# Patient Record
Sex: Male | Born: 1990 | Race: White | Hispanic: No | Marital: Single | Smoking: Current every day smoker
Health system: Southern US, Community
[De-identification: ages and names within clinical notes are randomized; demographics above are authoritative.]

## PROBLEM LIST (undated history)

## (undated) DIAGNOSIS — F329 Major depressive disorder, single episode, unspecified: Secondary | ICD-10-CM

## (undated) DIAGNOSIS — F419 Anxiety disorder, unspecified: Secondary | ICD-10-CM

## (undated) HISTORY — PX: FRACTURE SURGERY: SHX138

---
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
## 2014-10-16 DIAGNOSIS — F419 Anxiety disorder, unspecified: Secondary | ICD-10-CM

## 2014-10-16 DIAGNOSIS — F32A Depression, unspecified: Secondary | ICD-10-CM

## 2014-10-16 HISTORY — DX: Depression, unspecified: F32.A

## 2014-10-16 HISTORY — DX: Anxiety disorder, unspecified: F41.9

## 2016-01-18 ENCOUNTER — Ambulatory Visit (INDEPENDENT_AMBULATORY_CARE_PROVIDER_SITE_OTHER): Payer: Worker's Compensation

## 2016-01-18 ENCOUNTER — Ambulatory Visit (INDEPENDENT_AMBULATORY_CARE_PROVIDER_SITE_OTHER): Payer: Worker's Compensation | Admitting: Family Medicine

## 2016-01-18 ENCOUNTER — Encounter: Payer: Self-pay | Admitting: Family Medicine

## 2016-01-18 VITALS — BP 133/73 | HR 56 | Temp 98.3°F | Ht 72.0 in | Wt 216.0 lb

## 2016-01-18 DIAGNOSIS — S9032XA Contusion of left foot, initial encounter: Secondary | ICD-10-CM

## 2016-01-18 DIAGNOSIS — M25572 Pain in left ankle and joints of left foot: Secondary | ICD-10-CM

## 2016-01-18 NOTE — Progress Notes (Signed)
BP 133/73 mmHg  Pulse 56  Temp(Src) 98.3 F (36.8 C) (Oral)  Ht 6' (1.829 m)  Wt 216 lb (97.977 kg)  BMI 29.29 kg/m2   Subjective:    Patient ID: Warren Khan, male    DOB: [birth year], 25 y.o.   MRN: [id number]  HPI: Warren Khan is a 25 y.o. male presenting on 01/18/2016 for Left foot pain, dropped a tv on foot   HPI Left foot pain Patient comes in as a worker's comp visit where he works at [employer]. He injured his foot today on 01/18/2016. He dropped a television that he was carrying onto that left foot and has been having pain and difficulty walking on that left foot since then.He does have some difficulty bearing weight on that foot. He has some swelling and bruising as well.  Relevant past medical, surgical, family and social history reviewed and updated as indicated. Interim medical history since our last visit reviewed. Allergies and medications reviewed and updated.  Review of Systems  Constitutional: Negative for fever.  HENT: Negative for ear discharge and ear pain.   Eyes: Negative for discharge and visual disturbance.  Respiratory: Negative for shortness of breath and wheezing.   Cardiovascular: Negative for chest pain and leg swelling.  Gastrointestinal: Negative for abdominal pain, diarrhea and constipation.  Genitourinary: Negative for difficulty urinating.  Musculoskeletal: Positive for joint swelling and arthralgias. Negative for myalgias, back pain and gait problem.  Skin: Positive for color change. Negative for rash.  Neurological: Negative for syncope, light-headedness and headaches.  All other systems reviewed and are negative.   Per HPI unless specifically indicated above     Medication List    Notice  As of 01/18/2016 12:00 PM   You have not been prescribed any medications.        Objective:    BP 133/73 mmHg  Pulse 56  Temp(Src) 98.3 F (36.8 C) (Oral)  Ht 6' (1.829 m)  Wt 216 lb (97.977 kg)  BMI 29.29 kg/m2  Wt  Readings from Last 3 Encounters:  01/18/16 216 lb (97.977 kg)    Physical Exam  Constitutional: He is oriented to person, place, and time. He appears well-developed and well-nourished. No distress.  Eyes: Conjunctivae and EOM are normal. Pupils are equal, round, and reactive to light. Right eye exhibits no discharge. No scleral icterus.  Neck: Neck supple. No thyromegaly present.  Cardiovascular: Normal rate, regular rhythm, normal heart sounds and intact distal pulses.   No murmur heard. Pulmonary/Chest: Effort normal and breath sounds normal. No respiratory distress. He has no wheezes.  Musculoskeletal: Normal range of motion. He exhibits no edema.       Left foot: There is tenderness.       Feet:  Lymphadenopathy:    He has no cervical adenopathy.  Neurological: He is alert and oriented to person, place, and time. Coordination normal.  Skin: Skin is warm and dry. Ecchymosis noted. No laceration, no lesion and no rash noted. He is not diaphoretic. No erythema.  Psychiatric: He has a normal mood and affect. His behavior is normal.  Vitals reviewed.  X-ray left foot: No signs of acute bony abnormalities or fractures.    Assessment & Plan:   Problem List Items Addressed This Visit    None    Visit Diagnoses    Pain in joint, ankle and foot, left    -  Primary    Use anti-inflammatories, ice, heat and stretching    Relevant Orders  DG Foot Complete Left (Completed)    Foot contusion, left, initial encounter        Because of bruising and difficulty walking on left foot, will give off the rest of this week because his job requires a lot of lifting and walking. back on 4/10       Follow up plan: Return if symptoms worsen or fail to improve.  Counseling provided for all of the vaccine components Orders Placed This Encounter  Procedures  . DG Foot Complete Left    Arville CareJoshua Royce Sciara, MD [facility] 01/18/2016, 12:00 PM

## 2017-01-14 IMAGING — CR DG FOOT COMPLETE 3+V*L*
3 series · 3 of 3 positions shown · non-contrast
Comparison: None.

CLINICAL DATA: Dropped TV on left foot, left foot pain and swelling

EXAM:
LEFT FOOT - COMPLETE 3+ VIEW

[view not recorded (1 of 3)]
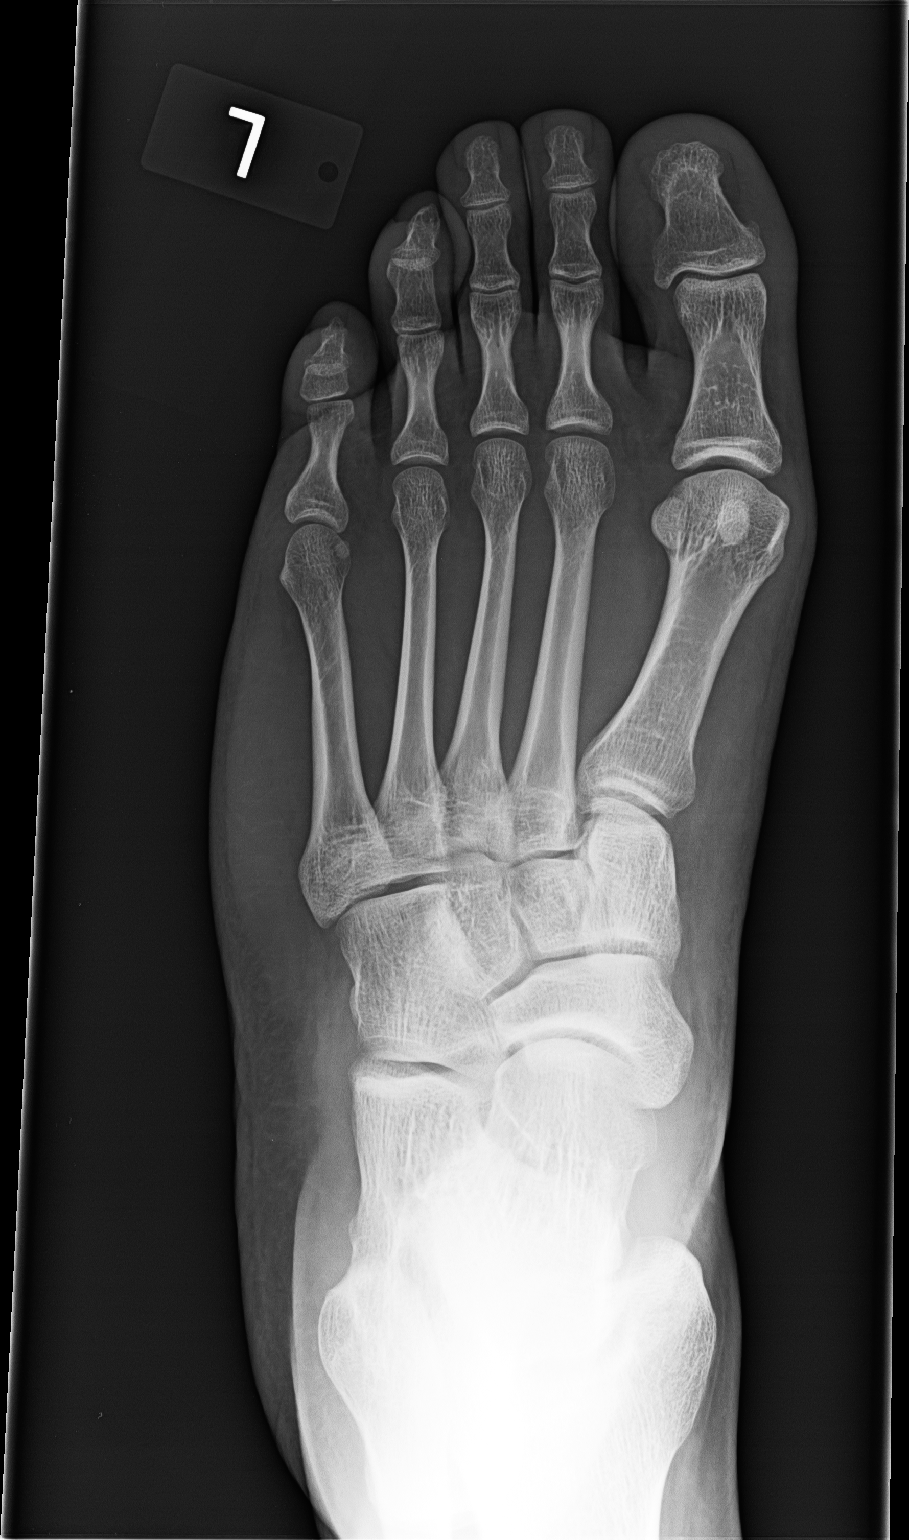

[view not recorded (2 of 3)]
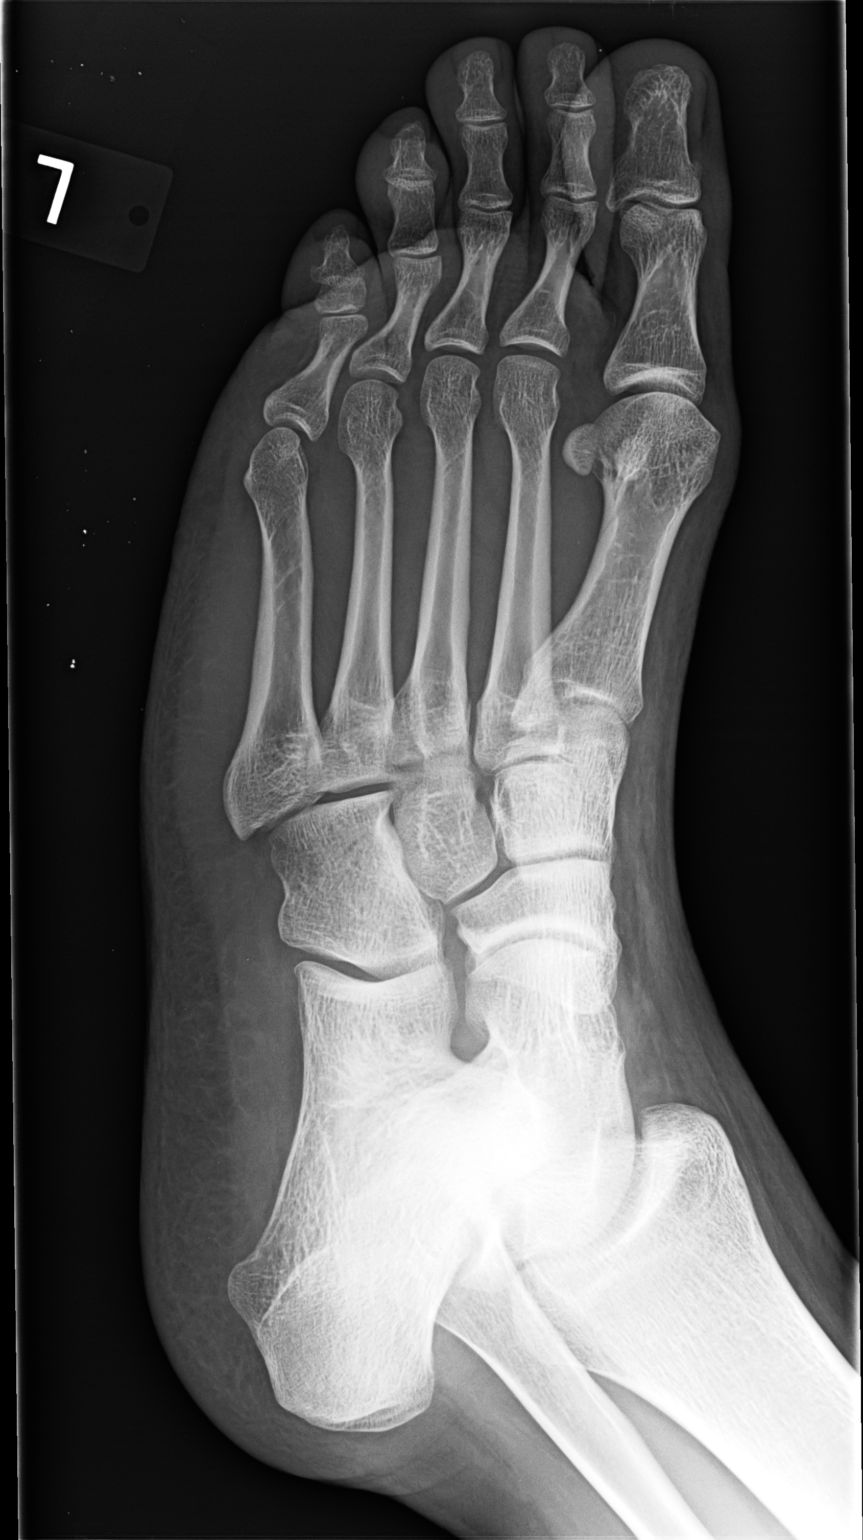

[view not recorded (3 of 3)]
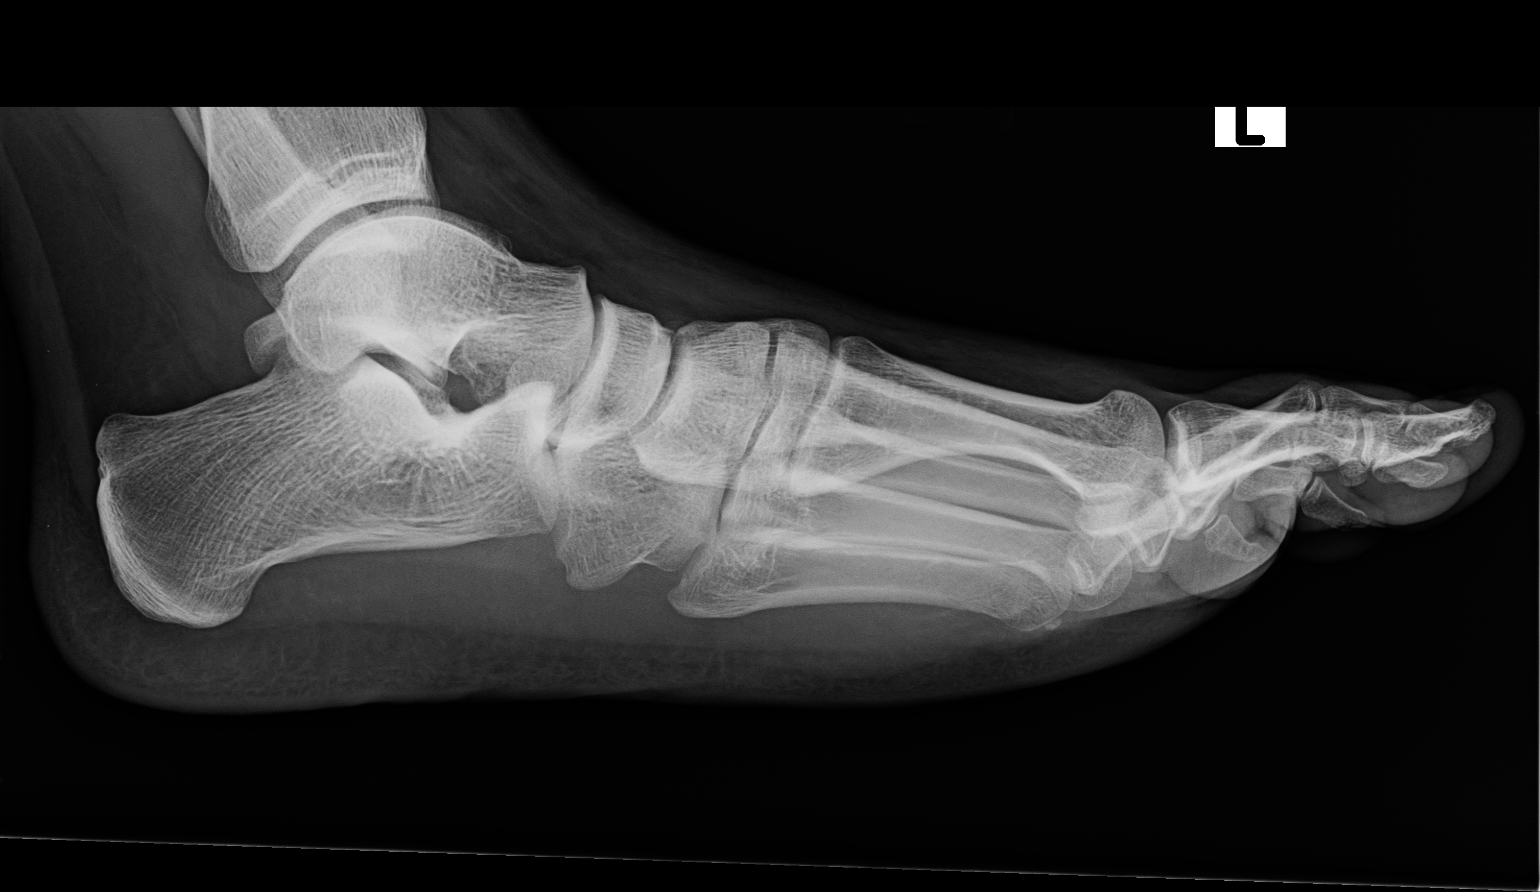

[3 of 3 positions shown; findings below may reference images not displayed]

FINDINGS: Three views of the left foot submitted. No acute fracture or
subluxation. No radiopaque foreign body. Question flatfoot
deformity.
IMPRESSION: No acute fracture or subluxation. No radiopaque foreign body.
Question flatfoot deformity.

## 2018-11-28 ENCOUNTER — Encounter (HOSPITAL_COMMUNITY): Payer: Self-pay | Admitting: Emergency Medicine

## 2018-11-28 ENCOUNTER — Emergency Department (HOSPITAL_COMMUNITY)
Admission: EM | Admit: 2018-11-28 | Discharge: 2018-11-28 | Disposition: A | Discharge status: Home / Self Care | Payer: Worker's Compensation | Attending: Emergency Medicine | Admitting: Emergency Medicine

## 2018-11-28 ENCOUNTER — Emergency Department (HOSPITAL_COMMUNITY): Payer: Worker's Compensation

## 2018-11-28 ENCOUNTER — Other Ambulatory Visit: Payer: Self-pay

## 2018-11-28 DIAGNOSIS — J705 Respiratory conditions due to smoke inhalation: Secondary | ICD-10-CM | POA: Diagnosis present

## 2018-11-28 DIAGNOSIS — F17210 Nicotine dependence, cigarettes, uncomplicated: Secondary | ICD-10-CM | POA: Insufficient documentation

## 2018-11-28 DIAGNOSIS — Y9269 Other specified industrial and construction area as the place of occurrence of the external cause: Secondary | ICD-10-CM | POA: Insufficient documentation

## 2018-11-28 DIAGNOSIS — Y990 Civilian activity done for income or pay: Secondary | ICD-10-CM | POA: Diagnosis not present

## 2018-11-28 DIAGNOSIS — X000XXA Exposure to flames in uncontrolled fire in building or structure, initial encounter: Secondary | ICD-10-CM | POA: Diagnosis not present

## 2018-11-28 DIAGNOSIS — Y9389 Activity, other specified: Secondary | ICD-10-CM | POA: Insufficient documentation

## 2018-11-28 DIAGNOSIS — T59811A Toxic effect of smoke, accidental (unintentional), initial encounter: Secondary | ICD-10-CM

## 2018-11-28 HISTORY — DX: Major depressive disorder, single episode, unspecified: F32.9

## 2018-11-28 HISTORY — DX: Anxiety disorder, unspecified: F41.9

## 2018-11-28 LAB — COOXEMETRY PANEL
Carboxyhemoglobin: 3.9 % — ABNORMAL HIGH (ref 0.5–1.5)
METHEMOGLOBIN: 0.8 % (ref 0.0–1.5)
O2 Saturation: 83.5 %
Total hemoglobin: 14.2 g/dL (ref 12.0–16.0)
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
